# Patient Record
Sex: Female | Born: 2000 | Race: White | Hispanic: No | Marital: Single | Smoking: Never smoker
Health system: Southern US, Community
[De-identification: ages and names within clinical notes are randomized; demographics above are authoritative.]

## PROBLEM LIST (undated history)

## (undated) DIAGNOSIS — K219 Gastro-esophageal reflux disease without esophagitis: Secondary | ICD-10-CM

---
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
## 2020-03-07 DIAGNOSIS — E860 Dehydration: Secondary | ICD-10-CM
